# Patient Record
Sex: Male | Born: 1985 | Race: White | Hispanic: No | Marital: Single | Smoking: Current every day smoker
Health system: Southern US, Community
[De-identification: ages and names within clinical notes are randomized; demographics above are authoritative.]

---
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
## 2005-03-03 ENCOUNTER — Emergency Department (HOSPITAL_COMMUNITY): Admission: EM | Admit: 2005-03-03 | Discharge: 2005-03-03 | Payer: Self-pay | Admitting: Emergency Medicine

## 2005-03-21 ENCOUNTER — Emergency Department (HOSPITAL_COMMUNITY): Admission: EM | Admit: 2005-03-21 | Discharge: 2005-03-21 | Payer: Self-pay | Admitting: Emergency Medicine

## 2005-04-06 ENCOUNTER — Emergency Department (HOSPITAL_COMMUNITY): Admission: EM | Admit: 2005-04-06 | Discharge: 2005-04-06 | Payer: Self-pay | Admitting: *Deleted

## 2005-06-09 ENCOUNTER — Emergency Department (HOSPITAL_COMMUNITY): Admission: EM | Admit: 2005-06-09 | Discharge: 2005-06-09 | Payer: Self-pay | Admitting: Emergency Medicine

## 2005-08-27 ENCOUNTER — Encounter: Admission: RE | Admit: 2005-08-27 | Discharge: 2005-08-27 | Payer: Self-pay | Admitting: Orthopedic Surgery

## 2006-02-08 ENCOUNTER — Emergency Department (HOSPITAL_COMMUNITY): Admission: EM | Admit: 2006-02-08 | Discharge: 2006-02-08 | Payer: Self-pay | Admitting: Emergency Medicine

## 2007-10-12 IMAGING — US US MISC SOFT TISSUE
1 series · 13 of 16 positions shown · non-contrast
Comparison: none

CLINICAL DATA: 20-year-old with dorsal and volar wrist lesions.  
SOFT TISSUE ULTRASOUND OF THE RIGHT WRIST:
TECHNIQUE: Ultrasound examination of the soft tissues was performed in the area of clinical concern.

[Series 1: unknown · 0.07mm/px · 13 of 29 slices shown]
[im 1/29]
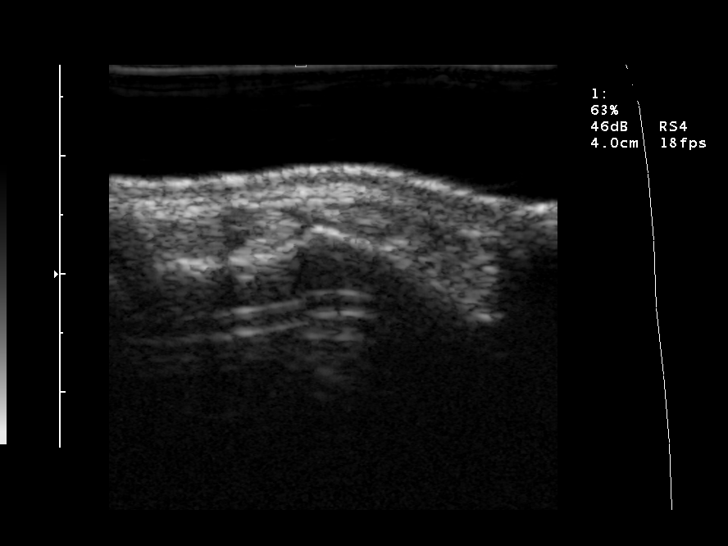
[im 2/29]
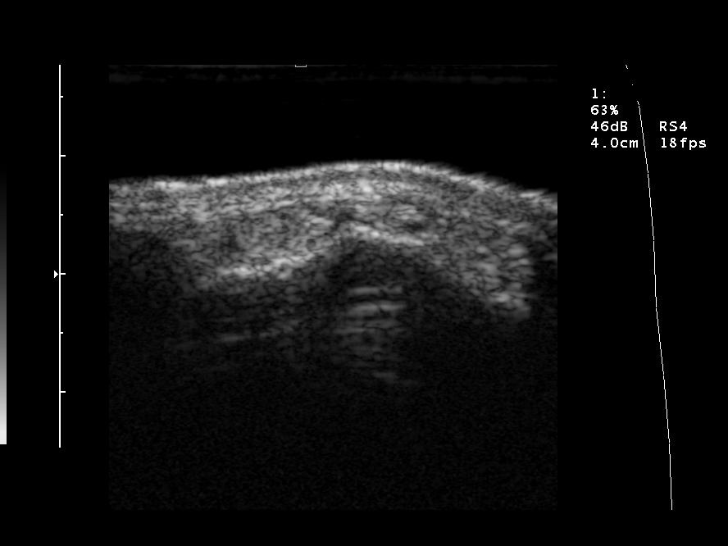
[im 6/29]
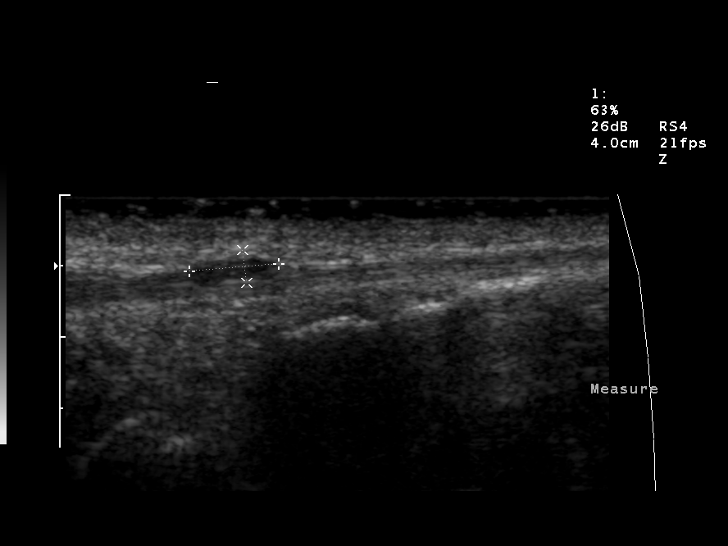
[im 8/29]
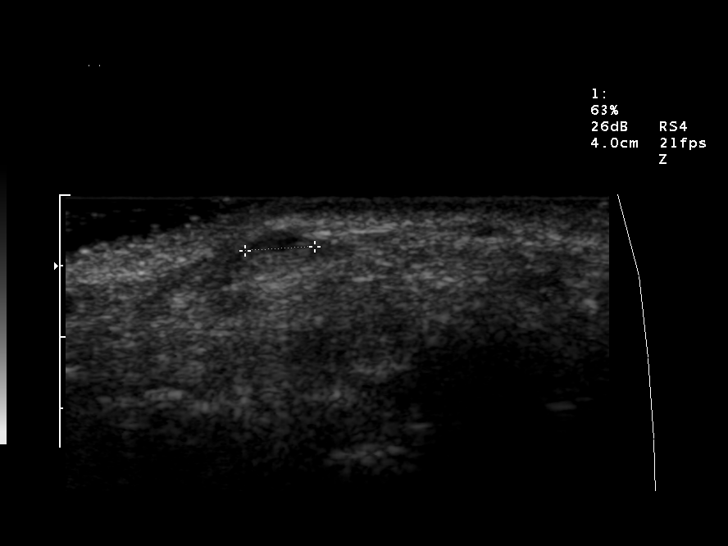
[im 10/29]
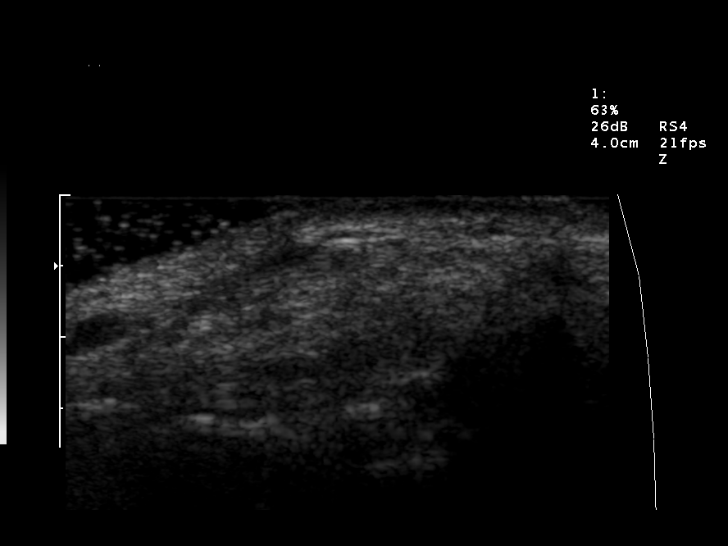
[im 12/29]
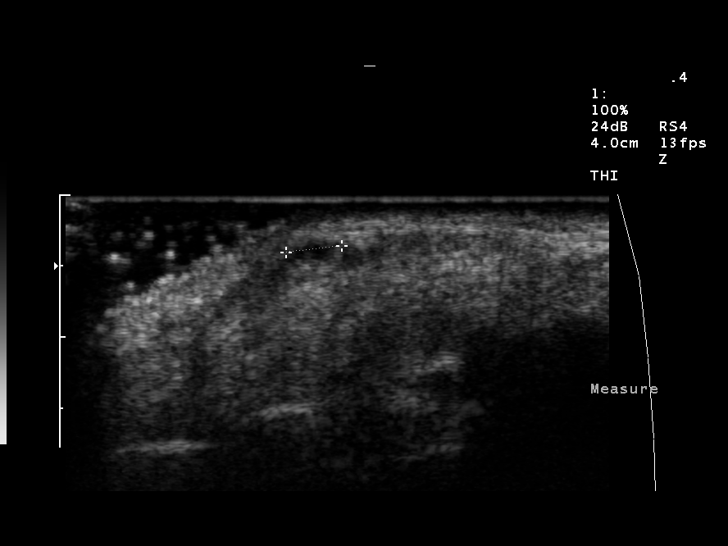
[im 15/29]
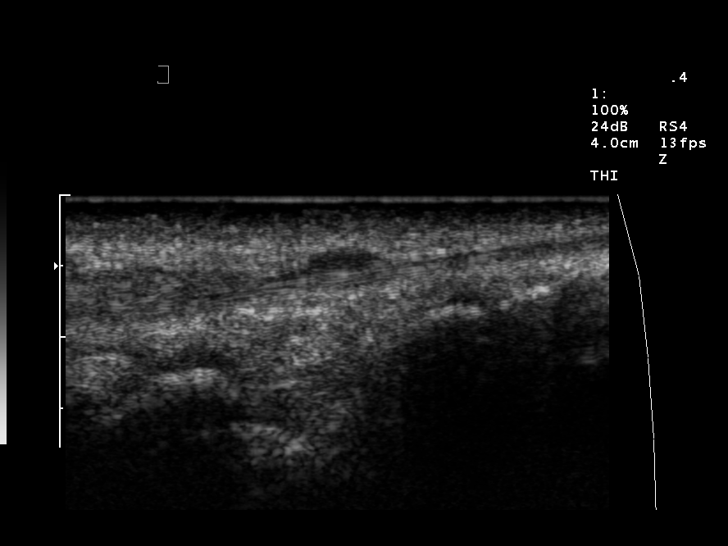
[im 17/29]
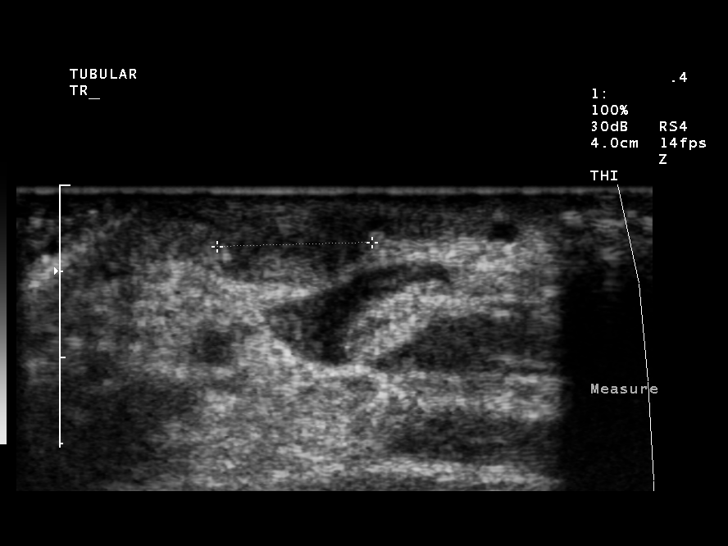
[im 19/29]
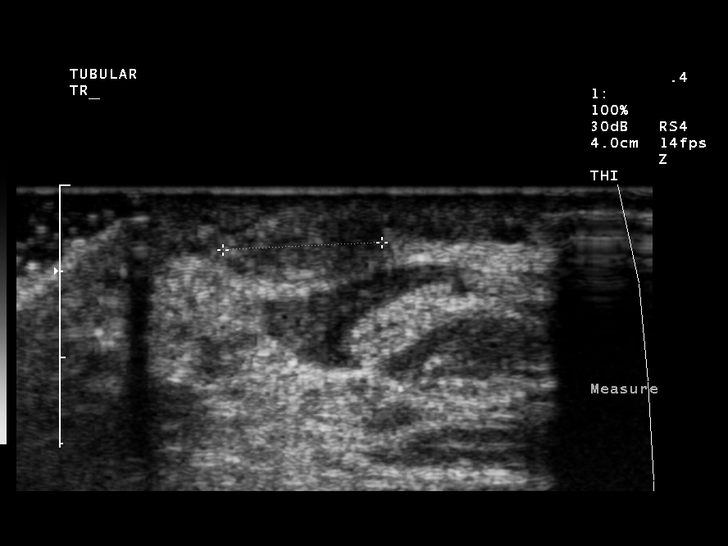
[im 21/29]
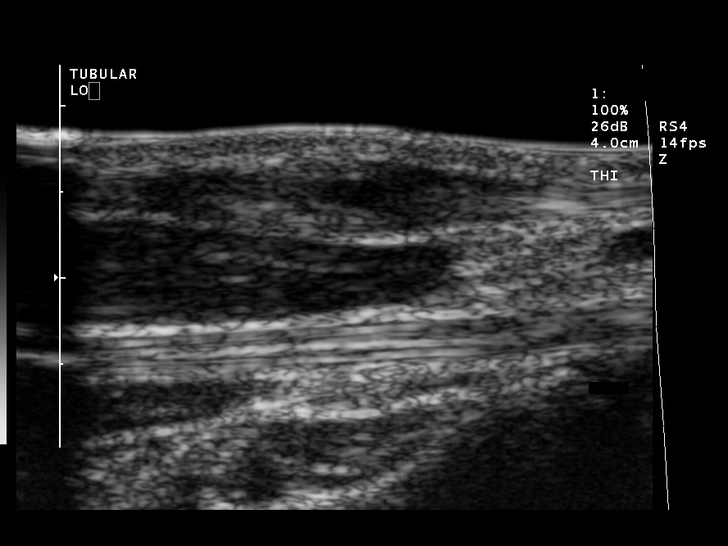
[im 23/29]
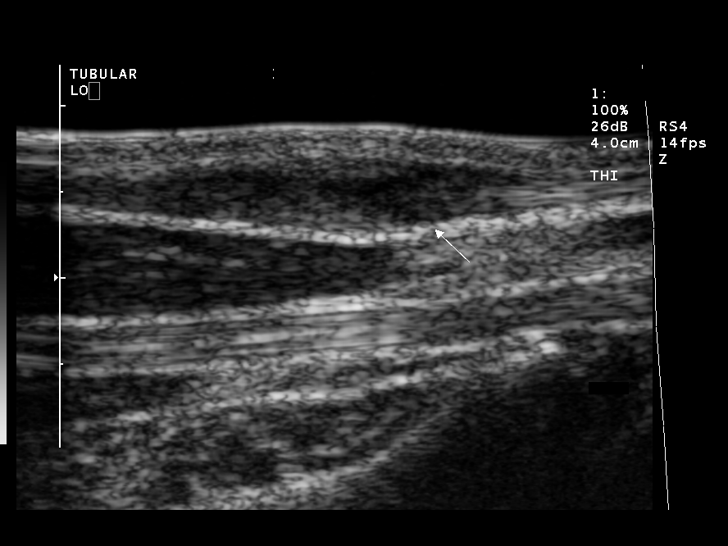
[im 27/29]
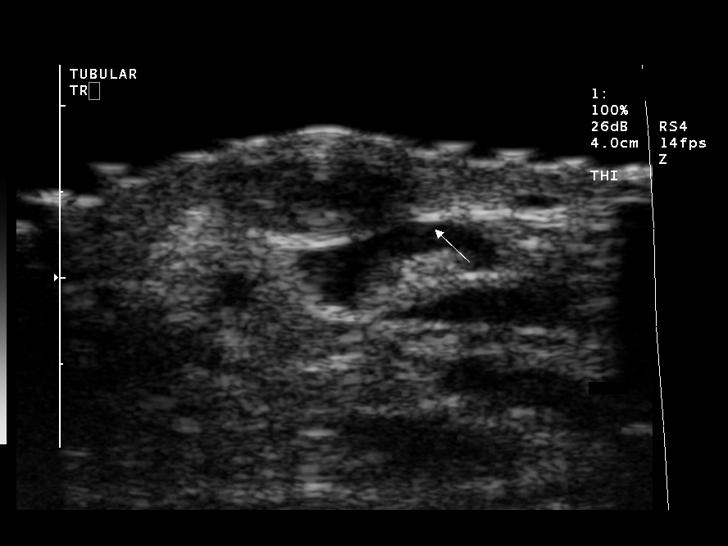
[im 29/29]
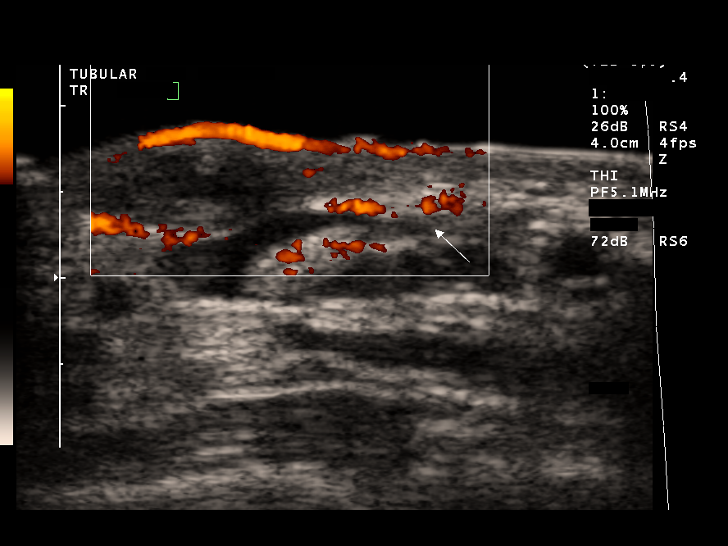

[13 of 16 positions shown; findings below may reference images not displayed]

FINDINGS: There is a small hypoechoic nodule associate with the extensor tendon of the middle digit.  It does not have the appearance of a simple cyst.  It certainly could be a complex ganglion cyst.  A small giant cell tumor of the tendon sheath is also possible.  It moves with the tendon.  On the volar aspect of the wrist there is a 9.2 x 9.0 cm oblong hypoechoic nodule in the superficial soft tissues.  This is superficial to the flexor tendons.  It may reflect a complex ganglion cyst.  A fibroma would be another possibility.  MR Ante Tonika be helpful to further evaluate both of these lesions.
IMPRESSION: 1.  Tiny dorsal wrist mass appears to be associated with the extensor tendon of the third digit and may be a small complex ganglion cyst.  Fibroma or small giant cell tumor of the tendon sheath are also possibilities. 
2.  9 mm lesion on the volar aspect of the wrist not associated with the flexor tendons.  This is in the superficial soft tissues and may reflect a complex ganglion cyst, fibroma or subcutaneous cyst.

## 2008-02-04 ENCOUNTER — Emergency Department (HOSPITAL_COMMUNITY): Admission: EM | Admit: 2008-02-04 | Discharge: 2008-02-04 | Payer: Self-pay | Admitting: Emergency Medicine

## 2008-03-14 ENCOUNTER — Emergency Department (HOSPITAL_COMMUNITY): Admission: EM | Admit: 2008-03-14 | Discharge: 2008-03-14 | Payer: Self-pay | Admitting: Family Medicine

## 2008-04-18 ENCOUNTER — Ambulatory Visit: Payer: Self-pay | Admitting: Psychiatry

## 2008-04-18 ENCOUNTER — Inpatient Hospital Stay (HOSPITAL_COMMUNITY): Admission: AD | Admit: 2008-04-18 | Discharge: 2008-04-19 | Payer: Self-pay | Admitting: Psychiatry

## 2008-12-29 ENCOUNTER — Emergency Department (HOSPITAL_COMMUNITY): Admission: EM | Admit: 2008-12-29 | Discharge: 2008-12-29 | Payer: Self-pay | Admitting: Family Medicine

## 2009-01-04 ENCOUNTER — Emergency Department (HOSPITAL_COMMUNITY): Admission: EM | Admit: 2009-01-04 | Discharge: 2009-01-04 | Payer: Self-pay | Admitting: Emergency Medicine

## 2010-06-29 ENCOUNTER — Encounter: Payer: Self-pay | Admitting: Orthopedic Surgery

## 2010-09-04 ENCOUNTER — Emergency Department (HOSPITAL_COMMUNITY): Payer: Medicaid Other

## 2010-09-04 ENCOUNTER — Emergency Department (HOSPITAL_COMMUNITY)
Admission: EM | Admit: 2010-09-04 | Discharge: 2010-09-04 | Disposition: A | Discharge status: Home / Self Care | Payer: Medicaid Other | Attending: Emergency Medicine | Admitting: Emergency Medicine

## 2010-09-04 DIAGNOSIS — F17200 Nicotine dependence, unspecified, uncomplicated: Secondary | ICD-10-CM | POA: Insufficient documentation

## 2010-09-04 DIAGNOSIS — R071 Chest pain on breathing: Secondary | ICD-10-CM | POA: Insufficient documentation

## 2010-09-14 LAB — GONOCOCCUS DNA, PCR: GC Probe Amp, Genital: NEGATIVE

## 2010-09-14 LAB — COMPREHENSIVE METABOLIC PANEL
ALT: 14 U/L (ref 0–53)
AST: 17 U/L (ref 0–37)
Alkaline Phosphatase: 67 U/L (ref 39–117)
CO2: 27 mEq/L (ref 19–32)
Chloride: 108 mEq/L (ref 96–112)
Creatinine, Ser: 0.99 mg/dL (ref 0.4–1.5)
GFR calc Af Amer: >60 mL/min (ref >60)
GFR calc non Af Amer: >60 mL/min (ref >60)
Sodium: 141 mEq/L (ref 135–145)
Total Bilirubin: 0.5 mg/dL (ref 0.3–1.2)

## 2010-09-14 LAB — URINALYSIS, ROUTINE W REFLEX MICROSCOPIC
Hgb urine dipstick: NEGATIVE
Specific Gravity, Urine: 1.025 (ref 1.005–1.030)
Urobilinogen, UA: 0.2 mg/dL (ref 0.0–1.0)

## 2010-09-14 LAB — DIFFERENTIAL
Basophils Absolute: 0 10*3/uL (ref 0.0–0.1)
Eosinophils Absolute: 0.4 10*3/uL (ref 0.0–0.7)
Eosinophils Relative: 5 % (ref 0–5)

## 2010-09-14 LAB — URINE CULTURE

## 2010-09-14 LAB — LIPASE, BLOOD: Lipase: 27 U/L (ref 11–59)

## 2010-09-14 LAB — HIV ANTIBODY (ROUTINE TESTING W REFLEX): HIV: NONREACTIVE

## 2010-09-14 LAB — CBC
MCV: 92.2 fL (ref 78.0–100.0)
RBC: 4.42 MIL/uL (ref 4.22–5.81)
WBC: 7.9 10*3/uL (ref 4.0–10.5)
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
## 2011-03-04 ENCOUNTER — Emergency Department (HOSPITAL_COMMUNITY)
Admission: EM | Admit: 2011-03-04 | Discharge: 2011-03-04 | Discharge status: Against Medical Advice | Payer: Medicaid Other | Attending: Emergency Medicine | Admitting: Emergency Medicine

## 2011-11-22 ENCOUNTER — Emergency Department (HOSPITAL_COMMUNITY)
Admission: EM | Admit: 2011-11-22 | Discharge: 2011-11-22 | Discharge status: Absent without leave | Payer: Medicaid Other | Source: Home / Self Care

## 2012-06-15 ENCOUNTER — Encounter (HOSPITAL_COMMUNITY): Payer: Self-pay | Admitting: Emergency Medicine

## 2012-06-15 ENCOUNTER — Emergency Department (HOSPITAL_COMMUNITY)
Admission: EM | Admit: 2012-06-15 | Discharge: 2012-06-15 | Disposition: A | Discharge status: Home / Self Care | Payer: Medicaid Other | Source: Home / Self Care | Attending: Family Medicine | Admitting: Family Medicine

## 2012-06-15 DIAGNOSIS — B353 Tinea pedis: Secondary | ICD-10-CM

## 2012-06-15 MED ORDER — FLUCONAZOLE 150 MG PO TABS: ORAL_TABLET | ORAL | Status: DC

## 2012-06-15 MED ORDER — KETOCONAZOLE 2 % EX CREA: TOPICAL_CREAM | Freq: Every day | CUTANEOUS | Status: DC

## 2012-06-15 NOTE — ED Provider Notes (Signed)
History     CSN: [id number]  Arrival date & time 06/15/12  1035   First MD Initiated Contact with Patient 06/15/12 1047      Chief Complaint  Patient presents with  . Rash    (Consider location/radiation/quality/duration/timing/severity/associated sxs/prior treatment) HPI Comments: 27 year old smoker male. Otherwise no significant past medical history. Comes complaining of itchy bumps in his soles for over a month. States he had some fluid-filled bubbles with sticky clear fluid that get dry and and peel after he ruptures them. Rash is very pruriginous has used an over-the-counter athlete's foot cream with no improvement. He is "used to hang bare foot in the house".   History reviewed. No pertinent past medical history.  History reviewed. No pertinent past surgical history.  No family history on file.  History  Substance Use Topics  . Smoking status: Current Every Day Smoker  . Smokeless tobacco: Not on file  . Alcohol Use: Yes      Review of Systems  Constitutional: Negative for fever and chills.  Genitourinary: Negative for genital sores.  Musculoskeletal: Negative for arthralgias.  Skin: Positive for rash.  All other systems reviewed and are negative.    Allergies  Review of patient's allergies indicates no known allergies.  Home Medications   Current Outpatient Rx  Name  Route  Sig  Dispense  Refill  . OVER THE COUNTER MEDICATION      Athletes foot cream         . FLUCONAZOLE 150 MG PO TABS      1 tab by mouth weekly x4   4 tablet   0   . KETOCONAZOLE 2 % EX CREA   Topical   Apply topically daily.   15 g   0     BP 118/80  Pulse 69  Temp 98.2 F (36.8 C) (Oral)  Resp 18  SpO2 98%  Physical Exam  Nursing note and vitals reviewed. Constitutional: He is oriented to person, place, and time. He appears well-developed and well-nourished. No distress.  HENT:  Head: Normocephalic and atraumatic.  Eyes: Conjunctivae normal are normal.    Cardiovascular: Normal heart sounds.   Pulmonary/Chest: Breath sounds normal.  Neurological: He is alert and oriented to person, place, and time.  Skin: He is not diaphoretic.       Deep vesicular pruriginous rash with peeling involving soles and web spaces between toes. Rash is classic for dermatophytosis.  Rest of body skin including palms: clear    ED Course  Procedures (including critical care time)  Labs Reviewed - No data to display No results found.   1. Tinea pedis       MDM  Treated with ketoconazole 2% cream and fluconazole 150 mg weekly x4. Dermatology referral as needed.        Sharin Grave, MD 06/17/12 1124

## 2012-06-15 NOTE — ED Notes (Signed)
Reports bumps on right foot for maybe a month, also reports several bumps on left, reports he pops these bumps, the bumps go away only to return.  .  Reports he has used athletes foot cream with no improvement

## 2012-10-18 ENCOUNTER — Emergency Department (HOSPITAL_COMMUNITY)
Admission: EM | Admit: 2012-10-18 | Discharge: 2012-10-18 | Disposition: A | Discharge status: Home / Self Care | Payer: Medicaid Other | Source: Home / Self Care | Attending: Family Medicine | Admitting: Family Medicine

## 2012-10-18 ENCOUNTER — Encounter (HOSPITAL_COMMUNITY): Payer: Self-pay | Admitting: *Deleted

## 2012-10-18 DIAGNOSIS — L237 Allergic contact dermatitis due to plants, except food: Secondary | ICD-10-CM

## 2012-10-18 DIAGNOSIS — L255 Unspecified contact dermatitis due to plants, except food: Secondary | ICD-10-CM

## 2012-10-18 MED ORDER — TRIAMCINOLONE ACETONIDE 40 MG/ML IJ SUSP
40.0000 mg | Freq: Once | INTRAMUSCULAR | Status: AC
  Administered 2012-10-18: 40 mg via INTRAMUSCULAR

## 2012-10-18 MED ORDER — TRIAMCINOLONE ACETONIDE 40 MG/ML IJ SUSP
INTRAMUSCULAR | Status: AC
  Filled 2012-10-18: qty 5

## 2012-10-18 MED ORDER — METHYLPREDNISOLONE ACETATE 40 MG/ML IJ SUSP
80.0000 mg | Freq: Once | INTRAMUSCULAR | Status: AC
  Administered 2012-10-18: 80 mg via INTRAMUSCULAR

## 2012-10-18 MED ORDER — METHYLPREDNISOLONE ACETATE 80 MG/ML IJ SUSP
INTRAMUSCULAR | Status: AC
  Filled 2012-10-18: qty 1

## 2012-10-18 NOTE — ED Notes (Signed)
Itchy rash on both arms, left ABD and feet the past 3 days

## 2012-10-18 NOTE — ED Provider Notes (Addendum)
History     CSN: [id number]  Arrival date & time 10/18/12  1511   None     Chief Complaint  Patient presents with  . Rash    (Consider location/radiation/quality/duration/timing/severity/associated sxs/prior treatment) Patient is a 27 y.o. male presenting with rash. The history is provided by the patient.  Rash Location:  Full body Quality: blistering, itchiness, redness and weeping   Severity:  Mild Onset quality:  Gradual Duration:  3 days Timing:  Constant Context: plant contact   Relieved by:  Nothing   History reviewed. No pertinent past medical history.  History reviewed. No pertinent past surgical history.  No family history on file.  History  Substance Use Topics  . Smoking status: Current Every Day Smoker  . Smokeless tobacco: Not on file  . Alcohol Use: Yes      Review of Systems  Constitutional: Negative.   Skin: Positive for rash.    Allergies  Review of patient's allergies indicates no known allergies.  Home Medications  No current outpatient prescriptions on file.  BP 121/68  Pulse 79  Temp(Src) 97.1 F (36.2 C) (Oral)  Resp 16  SpO2 98%  Physical Exam  Nursing note and vitals reviewed. Constitutional: He is oriented to person, place, and time. He appears well-developed and well-nourished.  HENT:  Head: Normocephalic.  Neurological: He is alert and oriented to person, place, and time.  Skin: Skin is warm and dry. Rash noted.  Streaky papulovesicular pruritic patchy rash on arms and legs and abd    ED Course  Procedures (including critical care time)  Labs Reviewed - No data to display No results found.   1. Contact dermatitis due to poison ivy       MDM          Linna Hoff, MD 10/18/12 1608  Linna Hoff, MD 10/21/12 1005

## 2012-10-19 IMAGING — CR DG CHEST 2V
2 series · 2 of 2 positions shown · non-contrast
Comparison: None

CLINICAL DATA: Chest pain, smoker

CHEST - 2 VIEW

[w chest pa]
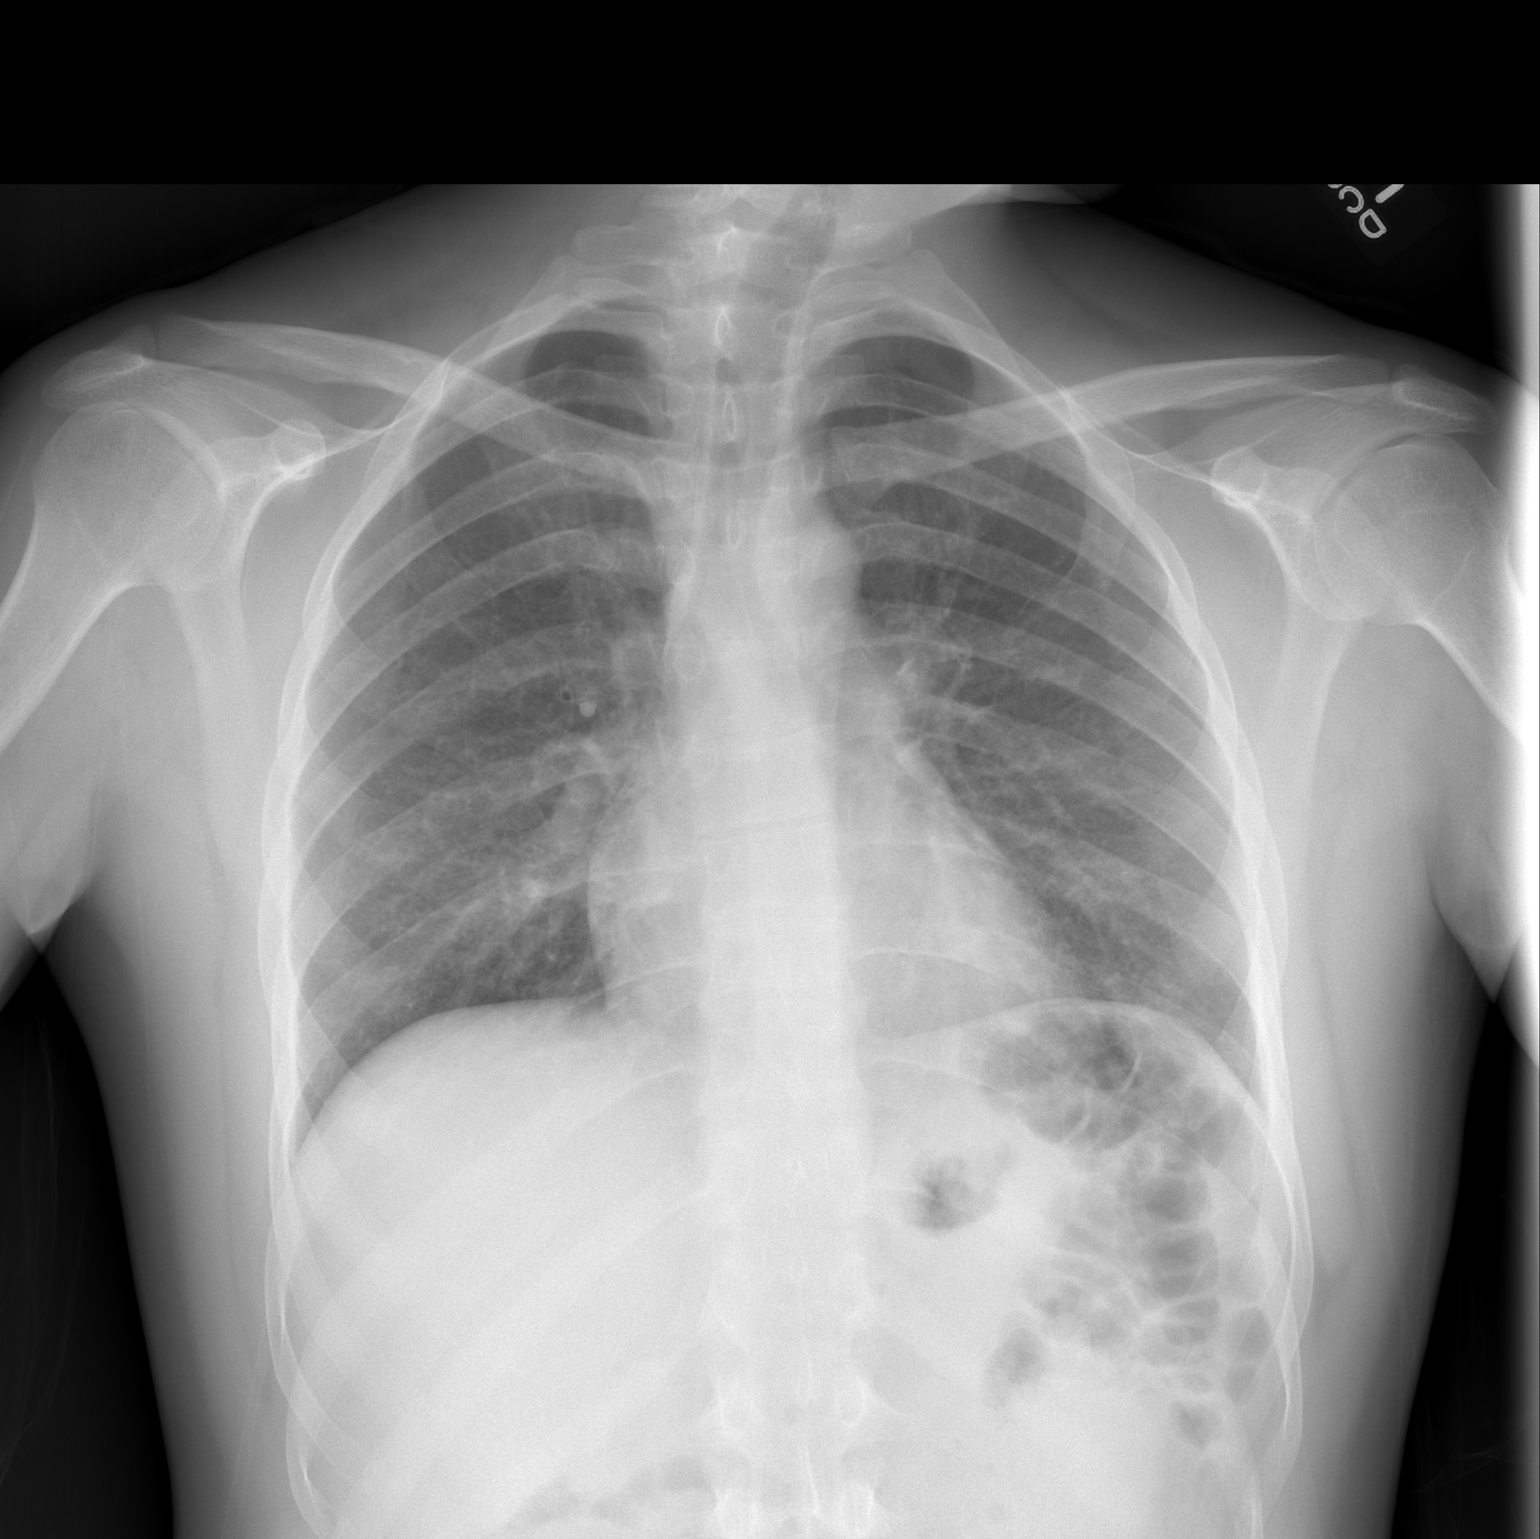

[w chest lat]
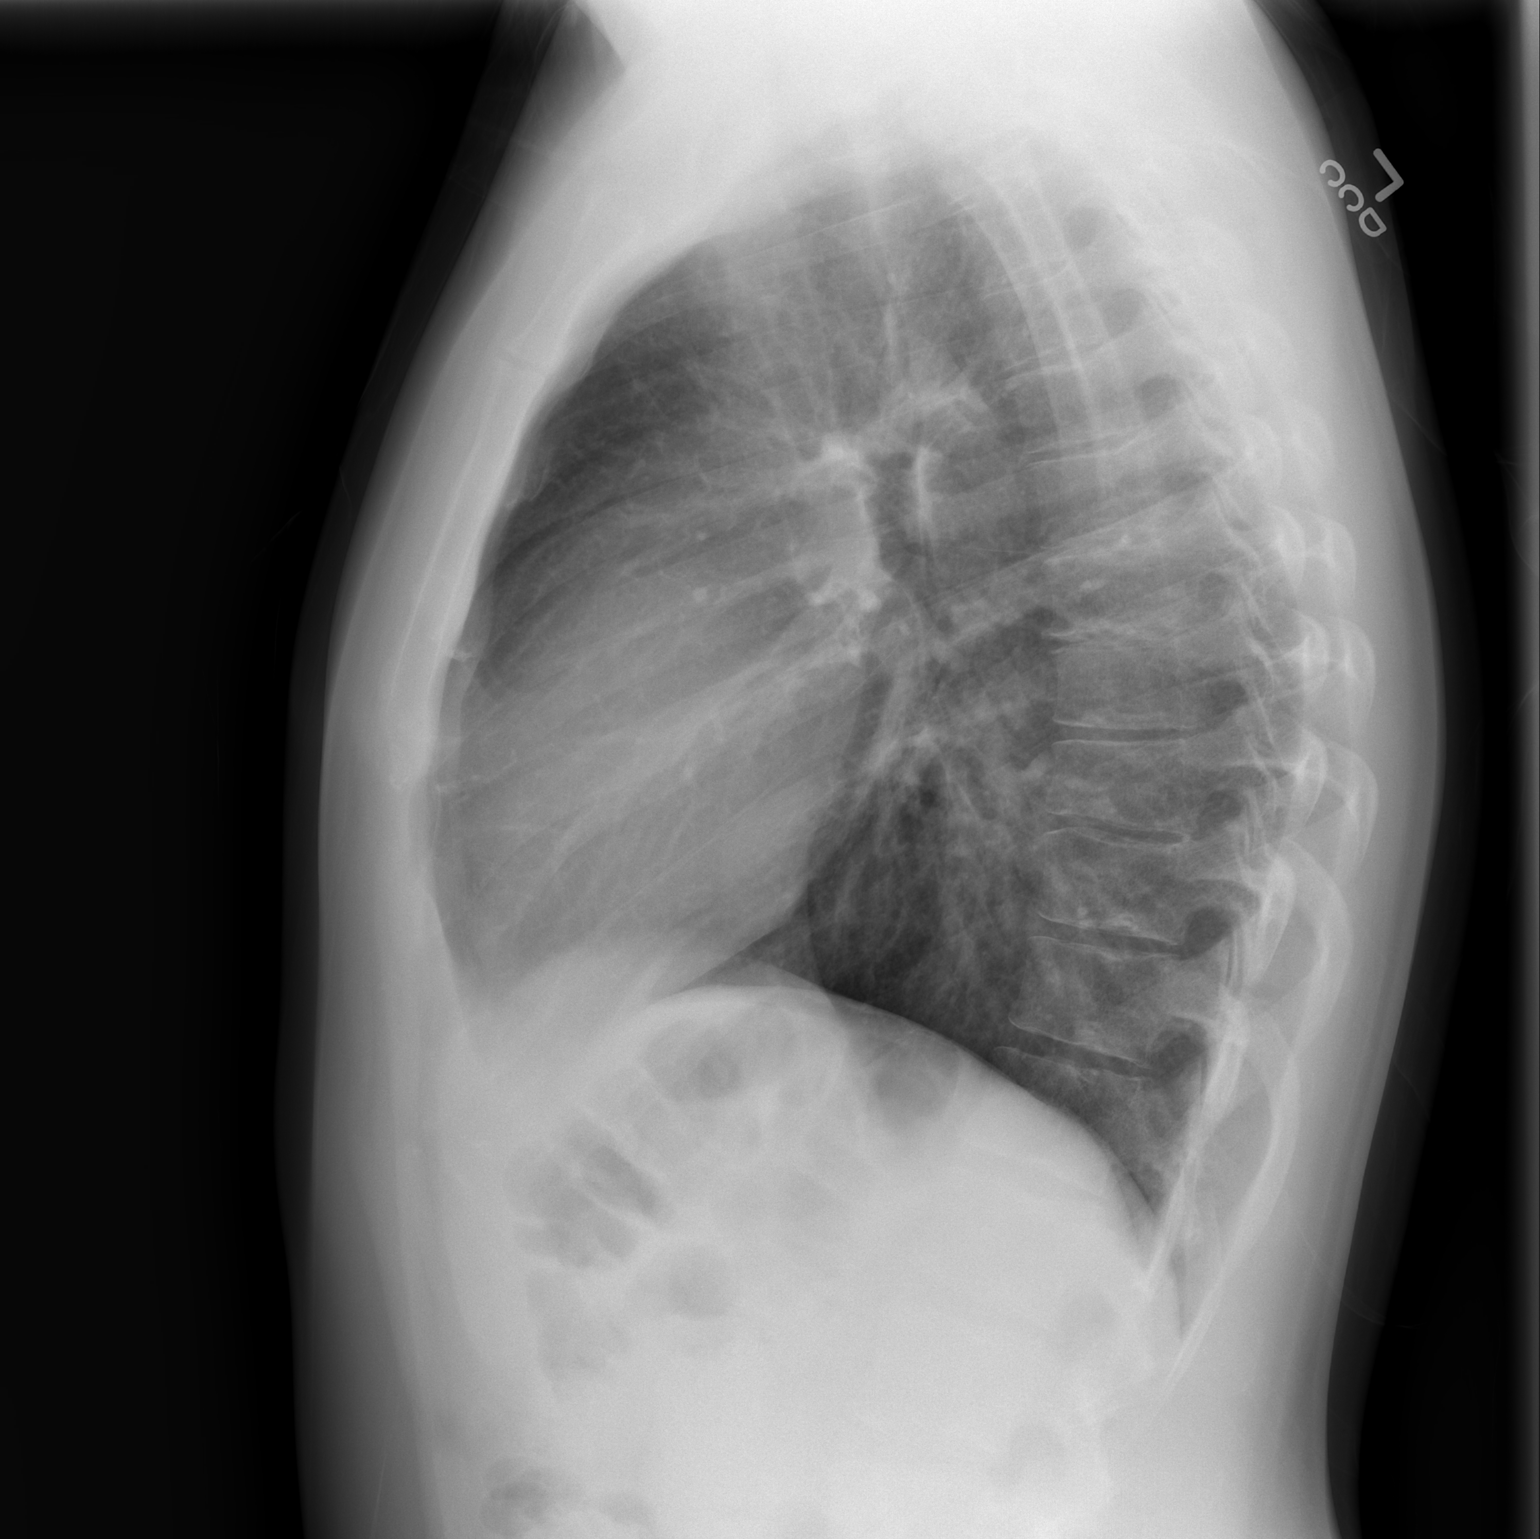

[2 of 2 positions shown; findings below may reference images not displayed]

FINDINGS: Normal heart size, mediastinal contours, and pulmonary vascularity.
Peribronchial thickening.
No definite pulmonary infiltrate or pleural effusion.
No pneumothorax.
Osseous structures unremarkable.
IMPRESSION: No acute abnormalities.

## 2013-04-02 ENCOUNTER — Encounter (HOSPITAL_COMMUNITY): Payer: Self-pay | Admitting: Emergency Medicine

## 2013-04-02 ENCOUNTER — Emergency Department (HOSPITAL_COMMUNITY)
Admission: EM | Admit: 2013-04-02 | Discharge: 2013-04-02 | Disposition: A | Discharge status: Home / Self Care | Payer: Medicaid Other | Source: Home / Self Care | Attending: Family Medicine | Admitting: Family Medicine

## 2013-04-02 DIAGNOSIS — L0231 Cutaneous abscess of buttock: Secondary | ICD-10-CM

## 2013-04-02 NOTE — ED Provider Notes (Signed)
CSN: [id number]     Arrival date & time 04/02/13  4098 History   First MD Initiated Contact with Patient 04/02/13 1020     Chief Complaint  Patient presents with  . Fall   (Consider location/radiation/quality/duration/timing/severity/associated sxs/prior Treatment) HPI Comments: 27 year old male presents complaining of severe worsening pain in his gluteal cleft. 2 days ago, he stumbled into a metal pole which it right into the top of his tailbone. He had significant pain at the time but this resolved throughout the day. Yesterday morning when he woke up, the area was very sore and the pain seemed to increase throughout the day. Last night, he had his girlfriend look at it she said it was red and there is a spot where pus was coming out. The pain continues to increase today so he came in to get checked. He denies fever, chills, dyschezia, NVD, or any other symptoms apart from the pain over his tailbone   History reviewed. No pertinent past medical history. History reviewed. No pertinent past surgical history. History reviewed. No pertinent family history. History  Substance Use Topics  . Smoking status: Current Every Day Smoker  . Smokeless tobacco: Not on file  . Alcohol Use: Yes    Review of Systems  Constitutional: Negative for fever, chills and fatigue.  HENT: Negative for sore throat.   Eyes: Negative for visual disturbance.  Respiratory: Negative for cough and shortness of breath.   Cardiovascular: Negative for chest pain, palpitations and leg swelling.  Gastrointestinal: Negative for nausea, vomiting, abdominal pain, diarrhea and constipation.  Genitourinary: Negative for dysuria, urgency, frequency and hematuria.  Musculoskeletal: Negative for arthralgias, myalgias, neck pain and neck stiffness.  Skin: Positive for wound (see history of present illness). Negative for rash.  Neurological: Negative for dizziness, weakness and light-headedness.    Allergies  Review of  patient's allergies indicates no known allergies.  Home Medications  No current outpatient prescriptions on file. BP 115/71  Pulse 73  Temp(Src) 98.3 F (36.8 C) (Oral)  SpO2 97% Physical Exam  Nursing note and vitals reviewed. Constitutional: He is oriented to person, place, and time. He appears well-developed and well-nourished. No distress.  HENT:  Head: Normocephalic.  Pulmonary/Chest: Effort normal. No respiratory distress.  Neurological: He is alert and oriented to person, place, and time. Coordination normal.  Skin: Skin is warm and dry. No rash noted. He is not diaphoretic.  Midline upper gluteal cleft: Erythema, one area of central purulence and fluctuance. No induration.  Psychiatric: He has a normal mood and affect. Judgment normal.    ED Course  Procedures (including critical care time) Labs Review Labs Reviewed  CULTURE, ROUTINE-ABSCESS   Imaging Review No results found.  Skin cleansed with alcohol swabs. Anesthesia with 2 mL of 2% lidocaine with epinephrine. Small incision made over the area of fluctuance with a very small amount of purulent material expressed.Cassandria Santee with antibiotic ointment. No packing done  MDM   1. Abscess, gluteal cleft    Small purulent site has been incised and drained. Culture has been sent. This is a very small abscess, no antibiotics indicated. I have advised sitz baths 4 times daily. Followup in 2 days to have this rechecked.     Graylon Good, PA-C 04/03/13 1223

## 2013-04-02 NOTE — ED Notes (Signed)
C/o falling and hurt tailbone.  Patient states partner looked at it and pus was coming out.  No treatment done.

## 2013-04-04 NOTE — ED Provider Notes (Signed)
Medical screening examination/treatment/procedure(s) were performed by a resident physician or non-physician practitioner and as the supervising physician I was immediately available for consultation/collaboration.  Evan Corey, MD    Evan S Corey, MD 04/04/13 0757 

## 2013-04-05 LAB — CULTURE, ROUTINE-ABSCESS

## 2013-04-08 ENCOUNTER — Telehealth (HOSPITAL_COMMUNITY): Payer: Self-pay

## 2013-04-08 NOTE — ED Notes (Signed)
Lab results reported MRSA.  No antibiotics given at time of visit.  Left message for patient.  Dr Lorenz Coaster reviewed chart RX approved for Septra DS 1 BID qty 20 no refills.

## 2013-04-11 NOTE — ED Notes (Signed)
I called pt. Pt. verified x 2 and given results.  Pt. told he did not need any antibiotics. I told him I noted he did not come back for a recheck on 10/28 and asked how it was doing.  He said it was much better.  I told him to come back for a recheck, if any worsening.  [name] 04/11/2013

## 2013-04-11 NOTE — ED Notes (Signed)
Error previous lab note.  Abscess culture gluteal cleft: Mod. Peptostreptococcus (not MRSA). Pt. had I and D done.  Lab shown to Dr. Artis Flock and he said no antibiotics needed. Pt. did not come back for recheck on 10/28. Vassie Moselle 04/11/2013

## 2013-08-19 ENCOUNTER — Encounter (HOSPITAL_COMMUNITY): Payer: Self-pay | Admitting: Emergency Medicine

## 2013-08-19 ENCOUNTER — Emergency Department (HOSPITAL_COMMUNITY)
Admission: EM | Admit: 2013-08-19 | Discharge: 2013-08-19 | Disposition: A | Discharge status: Home / Self Care | Payer: Medicaid Other | Source: Home / Self Care | Attending: Emergency Medicine | Admitting: Emergency Medicine

## 2013-08-19 DIAGNOSIS — T148XXA Other injury of unspecified body region, initial encounter: Secondary | ICD-10-CM

## 2013-08-19 DIAGNOSIS — T07XXXA Unspecified multiple injuries, initial encounter: Secondary | ICD-10-CM
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
## 2013-08-19 NOTE — Discharge Instructions (Signed)

## 2013-08-19 NOTE — ED Notes (Signed)
Patient presents for suture removal right hand and staple removal from scalp; also complains of knot and shoulder pain when moving.

## 2020-03-12 ENCOUNTER — Telehealth: Payer: Self-pay

## 2020-03-12 NOTE — Telephone Encounter (Signed)
Referral from [facility] [address], [place] Phone [phone] Fax [phone] Sent to Scheduling Notes in Chart Prep

## 2020-03-20 ENCOUNTER — Encounter: Payer: Self-pay | Admitting: General Practice

## 2020-04-03 ENCOUNTER — Telehealth: Payer: Self-pay | Admitting: Cardiology

## 2020-04-03 ENCOUNTER — Encounter: Payer: Self-pay | Admitting: Cardiology

## 2020-04-03 ENCOUNTER — Ambulatory Visit (INDEPENDENT_AMBULATORY_CARE_PROVIDER_SITE_OTHER): Payer: Medicaid Other | Admitting: Cardiology

## 2020-04-03 ENCOUNTER — Other Ambulatory Visit: Payer: Self-pay

## 2020-04-03 VITALS — BP 130/80 | HR 74 | Ht 67.0 in | Wt 207.8 lb

## 2020-04-03 DIAGNOSIS — Z13220 Encounter for screening for lipoid disorders: Secondary | ICD-10-CM

## 2020-04-03 DIAGNOSIS — R079 Chest pain, unspecified: Secondary | ICD-10-CM

## 2020-04-03 NOTE — Patient Instructions (Signed)
Medication Instructions:  Your physician recommends that you continue on your current medications as directed. Please refer to the Current Medication list given to you today.  *If you need a refill on your cardiac medications before your next appointment, please call your pharmacy*   Lab Work: CMET, CBC, TSH, Lipid, CRP, ESR today  If you have labs (blood work) drawn today and your tests are completely normal, you will receive your results only by: Marland Kitchen MyChart Message (if you have MyChart) OR . A paper copy in the mail If you have any lab test that is abnormal or we need to change your treatment, we will call you to review the results.   Testing/Procedures: Your physician has requested that you have an exercise tolerance test. For further information please visit [email or web address]. Please also follow instruction sheet, as given. --you will need a covid test 3 days prior to this, we will schedule this for you  Your physician has requested that you have an echocardiogram. Echocardiography is a painless test that uses sound waves to create images of your heart. It provides your doctor with information about the size and shape of your heart and how well your heart's chambers and valves are working. This procedure takes approximately one hour. There are no restrictions for this procedure.  This will be done at our [facility] location:  [place]: At [facility], you and your health needs are our priority.  As part of our continuing mission to provide you with exceptional heart care, we have created designated Provider Care Teams.  These Care Teams include your primary Cardiologist (physician) and Advanced Practice Providers (APPs -  Physician Assistants and Nurse Practitioners) who all work together to provide you with the care you need, when you need it.  We recommend signing up for the patient portal called "MyChart".  Sign up information is provided on  this After Visit Summary.  MyChart is used to connect with patients for Virtual Visits (Telemedicine).  Patients are able to view lab/test results, encounter notes, upcoming appointments, etc.  Non-urgent messages can be sent to your provider as well.   To learn more about what you can do with MyChart, go to [email or web address].    Your next appointment:   3 month(s)  The format for your next appointment:   In Person  Provider:   Oswaldo Milian, MD   Other Instructions You have been referred to Amy our careguide to assist with smoking cessation

## 2020-04-03 NOTE — Telephone Encounter (Signed)
Spoke with patient regarding appointment for COVID prescreening scheduled Tuesday 04/09/20 at 10:00 am at [facility]---pt informed to look for the COVID testing signs.  He voiced his understanding.

## 2020-04-03 NOTE — Progress Notes (Signed)
Cardiology Office Note:    Date:  04/03/2020   ID:  [name], DOB 11-01-1985, MRN [id number]  PCP:  Patient, No Pcp Per  Cardiologist:  No primary care provider on file.  Electrophysiologist:  None   Referring MD: Philmore Pali, NP   Chief Complaint  Patient presents with  . Chest Pain    History of Present Illness:    [name] is a 34 y.o. male with a hx of tobacco use, obesity who is referred by Mercer Pod, NP for evaluation of chest pain.  Reports he has had chest pain for years.  States that he saw cardiologist in the past but is not sure who he saw or where he was.  Reports he was treated for pericarditis after trauma to his chest last year.  States that he continues to have chest pain that he describes as a squeezing on the left side of his chest.  Can occur at rest or with exertion.  Also has been having shortness of breath.  Reports has never had a stress test before.  He has smoked half a pack per day for 20 years.  No known history of heart disease in his immediate family.   No past medical history on file.  No past surgical history on file.  Current Medications: Current Meds  Medication Sig  . albuterol (VENTOLIN HFA) 108 (90 Base) MCG/ACT inhaler Inhale 2 puffs into the lungs every 6 (six) hours as needed for wheezing or shortness of breath.     Allergies:   Patient has no known allergies.   Social History   Socioeconomic History  . Marital status: Single    Spouse name: Not on file  . Number of children: Not on file  . Years of education: Not on file  . Highest education level: Not on file  Occupational History  . Not on file  Tobacco Use  . Smoking status: Current Every Day Smoker  . Smokeless tobacco: Never Used  Substance and Sexual Activity  . Alcohol use: Yes  . Drug use: Yes    Types: Marijuana  . Sexual activity: Not on file  Other Topics Concern  . Not on file  Social History Narrative  . Not on file   Social Determinants  of Health   Financial Resource Strain:   . Difficulty of Paying Living Expenses: Not on file  Food Insecurity:   . Worried About Charity fundraiser in the Last Year: Not on file  . Ran Out of Food in the Last Year: Not on file  Transportation Needs:   . Lack of Transportation (Medical): Not on file  . Lack of Transportation (Non-Medical): Not on file  Physical Activity:   . Days of Exercise per Week: Not on file  . Minutes of Exercise per Session: Not on file  Stress:   . Feeling of Stress : Not on file  Social Connections:   . Frequency of Communication with Friends and Family: Not on file  . Frequency of Social Gatherings with Friends and Family: Not on file  . Attends Religious Services: Not on file  . Active Member of Clubs or Organizations: Not on file  . Attends Archivist Meetings: Not on file  . Marital Status: Not on file     Family History: No known history of heart disease in his immediate family  ROS:   Please see the history of present illness.     All other systems  reviewed and are negative.  EKGs/Labs/Other Studies Reviewed:    The following studies were reviewed today:   EKG:  EKG is  ordered today.  The ekg ordered today demonstrates normal sinus rhythm, rate 74, no ST abnormalities  Recent Labs: No results found for requested labs within last 8760 hours.  Recent Lipid Panel No results found for: CHOL, TRIG, HDL, CHOLHDL, VLDL, LDLCALC, LDLDIRECT  Physical Exam:    VS:  BP 130/80 (BP Location: Left Arm, Patient Position: Sitting)   Pulse 74   Ht 5' 7"  (1.702 m)   Wt 207 lb 12.8 oz (94.3 kg)   SpO2 99%   BMI 32.55 kg/m     Wt Readings from Last 3 Encounters:  04/03/20 207 lb 12.8 oz (94.3 kg)     GEN:  Well nourished, well developed in no acute distress HEENT: Normal NECK: No JVD; No carotid bruits LYMPHATICS: No lymphadenopathy CARDIAC: RRR, no murmurs, rubs, gallops RESPIRATORY:  Clear to auscultation without rales, wheezing  or rhonchi  ABDOMEN: Soft, non-tender, non-distended MUSCULOSKELETAL:  No edema; No deformity  SKIN: Warm and dry NEUROLOGIC:  Alert and oriented x 3 PSYCHIATRIC:  Normal affect   ASSESSMENT:    1. Chest pain of uncertain etiology   2. Lipid screening    PLAN:    Chest pain: Atypical in description but does have CAD risk factors (tobacco use).  EKG is interpretable and he is able to exercise.  Will evaluate for ischemia with ETT.  Will check echocardiogram to evaluate for structural heart disease.  Had an episode of pericarditis last year, will check ESR/CRP to evaluate for inflammation.    Lipid screening: No recent lipid panel, will check lipids  RTC in 3 months  Medication Adjustments/Labs and Tests Ordered: Current medicines are reviewed at length with the patient today.  Concerns regarding medicines are outlined above.  Orders Placed This Encounter  Procedures  . CBC  . Comprehensive metabolic panel  . Lipid panel  . Sedimentation rate  . C-reactive protein  . EXERCISE TOLERANCE TEST (ETT)  . EKG 12-Lead  . ECHOCARDIOGRAM COMPLETE   No orders of the defined types were placed in this encounter.   Patient Instructions  Medication Instructions:  Your physician recommends that you continue on your current medications as directed. Please refer to the Current Medication list given to you today.  *If you need a refill on your cardiac medications before your next appointment, please call your pharmacy*   Lab Work: CMET, CBC, TSH, Lipid, CRP, ESR today  If you have labs (blood work) drawn today and your tests are completely normal, you will receive your results only by: Marland Kitchen MyChart Message (if you have MyChart) OR . A paper copy in the mail If you have any lab test that is abnormal or we need to change your treatment, we will call you to review the results.   Testing/Procedures: Your physician has requested that you have an exercise tolerance test. For further  information please visit [email or web address]. Please also follow instruction sheet, as given. --you will need a covid test 3 days prior to this, we will schedule this for you  Your physician has requested that you have an echocardiogram. Echocardiography is a painless test that uses sound waves to create images of your heart. It provides your doctor with information about the size and shape of your heart and how well your heart's chambers and valves are working. This procedure takes approximately one hour. There are no restrictions for  this procedure.  This will be done at our [facility] location:  [place]: At [facility], you and your health needs are our priority.  As part of our continuing mission to provide you with exceptional heart care, we have created designated Provider Care Teams.  These Care Teams include your primary Cardiologist (physician) and Advanced Practice Providers (APPs -  Physician Assistants and Nurse Practitioners) who all work together to provide you with the care you need, when you need it.  We recommend signing up for the patient portal called "MyChart".  Sign up information is provided on this After Visit Summary.  MyChart is used to connect with patients for Virtual Visits (Telemedicine).  Patients are able to view lab/test results, encounter notes, upcoming appointments, etc.  Non-urgent messages can be sent to your provider as well.   To learn more about what you can do with MyChart, go to [email or web address].    Your next appointment:   3 month(s)  The format for your next appointment:   In Person  Provider:   Oswaldo Milian, MD   Other Instructions You have been referred to Amy our careguide to assist with smoking cessation      Signed, Donato Heinz, MD  04/03/2020 10:25 PM    [place]

## 2020-04-04 LAB — LIPID PANEL
Chol/HDL Ratio: 5.1 ratio — ABNORMAL HIGH (ref 0.0–5.0)
Cholesterol, Total: 192 mg/dL (ref 100–199)
HDL: 38 mg/dL — ABNORMAL LOW (ref >39)
LDL Chol Calc (NIH): 121 mg/dL — ABNORMAL HIGH (ref 0–99)
Triglycerides: 188 mg/dL — ABNORMAL HIGH (ref 0–149)
VLDL Cholesterol Cal: 33 mg/dL (ref 5–40)

## 2020-04-04 LAB — CBC
Hematocrit: 46.3 % (ref 37.5–51.0)
Hemoglobin: 15.2 g/dL (ref 13.0–17.7)
MCH: 30.6 pg (ref 26.6–33.0)
MCHC: 32.8 g/dL (ref 31.5–35.7)
MCV: 93 fL (ref 79–97)
Platelets: 296 10*3/uL (ref 150–450)
RBC: 4.97 x10E6/uL (ref 4.14–5.80)
RDW: 12.7 % (ref 11.6–15.4)
WBC: 12.3 10*3/uL — ABNORMAL HIGH (ref 3.4–10.8)

## 2020-04-04 LAB — COMPREHENSIVE METABOLIC PANEL
ALT: 19 IU/L (ref 0–44)
AST: 15 IU/L (ref 0–40)
Albumin/Globulin Ratio: 1.9 (ref 1.2–2.2)
Albumin: 5 g/dL (ref 4.0–5.0)
Alkaline Phosphatase: 74 IU/L (ref 44–121)
BUN/Creatinine Ratio: 14 (ref 9–20)
BUN: 11 mg/dL (ref 6–20)
Bilirubin Total: 0.3 mg/dL (ref 0.0–1.2)
CO2: 21 mmol/L (ref 20–29)
Calcium: 10 mg/dL (ref 8.7–10.2)
Chloride: 104 mmol/L (ref 96–106)
Creatinine, Ser: 0.77 mg/dL (ref 0.76–1.27)
GFR calc Af Amer: 137 mL/min/{1.73_m2} (ref >59)
GFR calc non Af Amer: 118 mL/min/{1.73_m2} (ref >59)
Globulin, Total: 2.6 g/dL (ref 1.5–4.5)
Glucose: 90 mg/dL (ref 65–99)
Potassium: 4.7 mmol/L (ref 3.5–5.2)
Sodium: 140 mmol/L (ref 134–144)
Total Protein: 7.6 g/dL (ref 6.0–8.5)

## 2020-04-04 LAB — SEDIMENTATION RATE: Sed Rate: 13 mm/hr (ref 0–15)

## 2020-04-04 LAB — C-REACTIVE PROTEIN: CRP: <1 mg/L (ref 0–10)

## 2020-04-09 ENCOUNTER — Other Ambulatory Visit: Payer: Self-pay

## 2020-04-09 ENCOUNTER — Other Ambulatory Visit
Admission: RE | Admit: 2020-04-09 | Discharge: 2020-04-09 | Disposition: A | Discharge status: Home / Self Care | Payer: Medicaid Other | Source: Ambulatory Visit | Attending: Diagnostic Radiology | Admitting: Diagnostic Radiology

## 2020-04-09 DIAGNOSIS — Z20822 Contact with and (suspected) exposure to covid-19: Secondary | ICD-10-CM | POA: Diagnosis not present

## 2020-04-09 DIAGNOSIS — Z01812 Encounter for preprocedural laboratory examination: Secondary | ICD-10-CM | POA: Diagnosis not present

## 2020-04-09 LAB — SARS CORONAVIRUS 2 (TAT 6-24 HRS): SARS Coronavirus 2: NEGATIVE

## 2020-04-10 ENCOUNTER — Telehealth (HOSPITAL_COMMUNITY): Payer: Self-pay | Admitting: *Deleted

## 2020-04-10 NOTE — Telephone Encounter (Signed)
Close encounter 

## 2020-04-11 ENCOUNTER — Other Ambulatory Visit: Payer: Self-pay

## 2020-04-11 ENCOUNTER — Ambulatory Visit (HOSPITAL_COMMUNITY)
Admission: RE | Admit: 2020-04-11 | Discharge: 2020-04-11 | Disposition: A | Discharge status: Home / Self Care | Payer: Medicaid Other | Source: Ambulatory Visit | Attending: Cardiology | Admitting: Cardiology

## 2020-04-11 DIAGNOSIS — R079 Chest pain, unspecified: Secondary | ICD-10-CM | POA: Diagnosis not present

## 2020-04-11 LAB — EXERCISE TOLERANCE TEST
Estimated workload: 11.6 METS
Exercise duration (min): 10 min
Exercise duration (sec): 0 s
MPHR: 186 {beats}/min
Peak HR: 164 {beats}/min
Percent HR: 88 %
Rest HR: 75 {beats}/min

## 2020-04-23 ENCOUNTER — Ambulatory Visit (INDEPENDENT_AMBULATORY_CARE_PROVIDER_SITE_OTHER): Payer: Medicaid Other

## 2020-04-23 ENCOUNTER — Other Ambulatory Visit: Payer: Self-pay

## 2020-04-23 DIAGNOSIS — R079 Chest pain, unspecified: Secondary | ICD-10-CM

## 2020-04-23 LAB — ECHOCARDIOGRAM COMPLETE
AR max vel: 3.43 cm2
AV Area VTI: 3.48 cm2
AV Area mean vel: 3.8 cm2
AV Mean grad: 3 mmHg
AV Peak grad: 7.1 mmHg
Ao pk vel: 1.33 m/s
Area-P 1/2: 4.49 cm2
Calc EF: 55.7 %
S' Lateral: 3.4 cm
Single Plane A2C EF: 56.4 %
Single Plane A4C EF: 51.7 %

## 2020-06-30 NOTE — Progress Notes (Deleted)
Cardiology Office Note:    Date:  06/30/2020   ID:  [name], DOB [birth year], MRN [id number]  PCP:  [facility]  Cardiologist:  No primary care provider on file.  Electrophysiologist:  None   Referring MD: No ref. provider found   No chief complaint on file.   History of Present Illness:    [name] is a 35 y.o. male with a hx of tobacco use, obesity who presents for follow-up.  He was referred by Mercer Pod, NP for evaluation of chest pain, initially seen on 04/03/2020.  Reports he has had chest pain for years.  States that he saw cardiologist in the past but is not sure who he saw or where he was.  Reports he was treated for pericarditis after trauma to his chest last year.  States that he continues to have chest pain that he describes as a squeezing on the left side of his chest.  Can occur at rest or with exertion.  Also has been having shortness of breath.  Reports has never had a stress test before.  He has smoked half a pack per day for 20 years.  No known history of heart disease in his immediate family.  Normal ESR/CRP on 04/03/2020.  Underwent ETT on 04/11/2020.  Excellent exercise capacity (11.6 METS), no evidence of ischemia.  Echocardiogram on 04/23/2020 showed normal biventricular function, no significant valvular disease.  Since last clinic visit,  Tobacco  No past medical history on file.  No past surgical history on file.  Current Medications: No outpatient medications have been marked as taking for the 07/03/20 encounter (Appointment) with Donato Heinz, MD.     Allergies:   Patient has no known allergies.   Social History   Socioeconomic History  . Marital status: Single    Spouse name: Not on file  . Number of children: Not on file  . Years of education: Not on file  . Highest education level: Not on file  Occupational History  . Not on file  Tobacco Use  . Smoking status: Current Every Day Smoker  .  Smokeless tobacco: Never Used  Substance and Sexual Activity  . Alcohol use: Yes  . Drug use: Yes    Types: Marijuana  . Sexual activity: Not on file  Other Topics Concern  . Not on file  Social History Narrative  . Not on file   Social Determinants of Health   Financial Resource Strain: Not on file  Food Insecurity: Not on file  Transportation Needs: Not on file  Physical Activity: Not on file  Stress: Not on file  Social Connections: Not on file     Family History: No known history of heart disease in his immediate family  ROS:   Please see the history of present illness.     All other systems reviewed and are negative.  EKGs/Labs/Other Studies Reviewed:    The following studies were reviewed today:   EKG:  EKG is  ordered today.  The ekg ordered today demonstrates normal sinus rhythm, rate 74, no ST abnormalities  Recent Labs: 04/03/2020: ALT 19; BUN 11; Creatinine, Ser 0.77; Hemoglobin 15.2; Platelets 296; Potassium 4.7; Sodium 140  Recent Lipid Panel    Component Value Date/Time   CHOL 192 04/03/2020 1250   TRIG 188 (H) 04/03/2020 1250   HDL 38 (L) 04/03/2020 1250   CHOLHDL 5.1 (H) 04/03/2020 1250   LDLCALC 121 (H) 04/03/2020 1250    Physical Exam:  VS:  There were no vitals taken for this visit.    Wt Readings from Last 3 Encounters:  04/03/20 207 lb 12.8 oz (94.3 kg)     GEN:  Well nourished, well developed in no acute distress HEENT: Normal NECK: No JVD; No carotid bruits LYMPHATICS: No lymphadenopathy CARDIAC: RRR, no murmurs, rubs, gallops RESPIRATORY:  Clear to auscultation without rales, wheezing or rhonchi  ABDOMEN: Soft, non-tender, non-distended MUSCULOSKELETAL:  No edema; No deformity  SKIN: Warm and dry NEUROLOGIC:  Alert and oriented x 3 PSYCHIATRIC:  Normal affect   ASSESSMENT:    No diagnosis found. PLAN:    Chest pain: Atypical in description but does have CAD risk factors (tobacco use).  Normal ESR/CRP on 04/03/2020.   Underwent ETT on 04/11/2020.  Excellent exercise capacity (11.6 METS), no evidence of ischemia.  Echocardiogram on 04/23/2020 showed normal biventricular function, no significant valvular disease.  Hyperlipidemia: LDL 121 on 04/03/2020.  No indication for statin at this time, recommend diet/exercise  Tobacco use:  RTC in ***  Medication Adjustments/Labs and Tests Ordered: Current medicines are reviewed at length with the patient today.  Concerns regarding medicines are outlined above.  No orders of the defined types were placed in this encounter.  No orders of the defined types were placed in this encounter.   There are no Patient Instructions on file for this visit.   Signed, Donato Heinz, MD  06/30/2020 2:20 PM    [facility]

## 2020-07-03 ENCOUNTER — Ambulatory Visit: Payer: Medicaid Other | Admitting: Cardiology

## 2023-06-17 ENCOUNTER — Other Ambulatory Visit: Payer: Self-pay | Admitting: Physician Assistant

## 2023-06-17 DIAGNOSIS — S56811A Strain of other muscles, fascia and tendons at forearm level, right arm, initial encounter: Secondary | ICD-10-CM

## 2023-06-21 ENCOUNTER — Other Ambulatory Visit: Payer: Self-pay | Admitting: Physician Assistant

## 2023-06-21 ENCOUNTER — Ambulatory Visit
Admission: RE | Admit: 2023-06-21 | Discharge: 2023-06-21 | Disposition: A | Discharge status: Home / Self Care | Payer: Medicaid Other | Source: Ambulatory Visit | Attending: Physician Assistant | Admitting: Physician Assistant

## 2023-06-21 DIAGNOSIS — S56811A Strain of other muscles, fascia and tendons at forearm level, right arm, initial encounter: Secondary | ICD-10-CM
